# Patient Record
Sex: Female | Born: 1955
Health system: Southern US, Community
[De-identification: ages and names within clinical notes are randomized; demographics above are authoritative.]

## PROBLEM LIST (undated history)

## (undated) DIAGNOSIS — G43909 Migraine, unspecified, not intractable, without status migrainosus: Secondary | ICD-10-CM

## (undated) DIAGNOSIS — J45909 Unspecified asthma, uncomplicated: Secondary | ICD-10-CM

---

## 2018-05-16 MED FILL — HYDROCHLOROTHIAZIDE 25 MG T: 25 | 90 days supply | Qty: 180 | Fill #0

## 2018-05-19 DIAGNOSIS — N951 Menopausal and female climacteric states: Secondary | ICD-10-CM | POA: Diagnosis not present

## 2018-05-19 DIAGNOSIS — R635 Abnormal weight gain: Secondary | ICD-10-CM | POA: Diagnosis not present

## 2018-05-22 DIAGNOSIS — G43009 Migraine without aura, not intractable, without status migrainosus: Secondary | ICD-10-CM | POA: Diagnosis not present

## 2018-05-22 DIAGNOSIS — Z6827 Body mass index (BMI) 27.0-27.9, adult: Secondary | ICD-10-CM | POA: Diagnosis not present

## 2018-05-22 DIAGNOSIS — J4520 Mild intermittent asthma, uncomplicated: Secondary | ICD-10-CM | POA: Diagnosis not present

## 2018-05-22 DIAGNOSIS — E782 Mixed hyperlipidemia: Secondary | ICD-10-CM | POA: Diagnosis not present

## 2018-09-08 DIAGNOSIS — Z299 Encounter for prophylactic measures, unspecified: Secondary | ICD-10-CM | POA: Diagnosis not present

## 2018-09-08 DIAGNOSIS — T7840XA Allergy, unspecified, initial encounter: Secondary | ICD-10-CM | POA: Diagnosis not present

## 2018-09-08 DIAGNOSIS — Z789 Other specified health status: Secondary | ICD-10-CM | POA: Diagnosis not present

## 2018-09-08 DIAGNOSIS — E7800 Pure hypercholesterolemia, unspecified: Secondary | ICD-10-CM | POA: Diagnosis not present

## 2018-09-08 DIAGNOSIS — J45909 Unspecified asthma, uncomplicated: Secondary | ICD-10-CM | POA: Diagnosis not present

## 2018-09-08 DIAGNOSIS — J309 Allergic rhinitis, unspecified: Secondary | ICD-10-CM | POA: Diagnosis not present

## 2018-10-13 MED FILL — HYDROCHLOROTHIAZIDE 25 MG T: 25 | 30 days supply | Qty: 60 | Fill #0

## 2018-10-20 ENCOUNTER — Other Ambulatory Visit (HOSPITAL_COMMUNITY): Payer: Self-pay | Admitting: Internal Medicine

## 2018-10-20 DIAGNOSIS — E7800 Pure hypercholesterolemia, unspecified: Secondary | ICD-10-CM | POA: Diagnosis not present

## 2018-10-20 DIAGNOSIS — Z6828 Body mass index (BMI) 28.0-28.9, adult: Secondary | ICD-10-CM | POA: Diagnosis not present

## 2018-10-20 DIAGNOSIS — Z1231 Encounter for screening mammogram for malignant neoplasm of breast: Secondary | ICD-10-CM

## 2018-10-20 DIAGNOSIS — Z1331 Encounter for screening for depression: Secondary | ICD-10-CM | POA: Diagnosis not present

## 2018-10-20 DIAGNOSIS — Z79899 Other long term (current) drug therapy: Secondary | ICD-10-CM | POA: Diagnosis not present

## 2018-10-20 DIAGNOSIS — R5383 Other fatigue: Secondary | ICD-10-CM | POA: Diagnosis not present

## 2018-10-20 DIAGNOSIS — Z299 Encounter for prophylactic measures, unspecified: Secondary | ICD-10-CM | POA: Diagnosis not present

## 2018-10-20 DIAGNOSIS — Z0000 Encounter for general adult medical examination without abnormal findings: Secondary | ICD-10-CM | POA: Diagnosis not present

## 2018-10-21 ENCOUNTER — Other Ambulatory Visit: Payer: Self-pay

## 2018-10-21 ENCOUNTER — Encounter (HOSPITAL_COMMUNITY): Payer: Self-pay

## 2018-10-21 ENCOUNTER — Ambulatory Visit (HOSPITAL_COMMUNITY)
Admission: RE | Admit: 2018-10-21 | Discharge: 2018-10-21 | Disposition: A | Discharge status: Home / Self Care | Payer: 59 | Source: Ambulatory Visit | Attending: Internal Medicine | Admitting: Internal Medicine

## 2018-10-21 DIAGNOSIS — Z1231 Encounter for screening mammogram for malignant neoplasm of breast: Secondary | ICD-10-CM | POA: Diagnosis not present

## 2020-05-12 ENCOUNTER — Other Ambulatory Visit: Payer: Self-pay

## 2020-05-12 DIAGNOSIS — R202 Paresthesia of skin: Secondary | ICD-10-CM

## 2020-07-03 ENCOUNTER — Other Ambulatory Visit: Payer: Self-pay

## 2020-07-03 DIAGNOSIS — R202 Paresthesia of skin: Secondary | ICD-10-CM

## 2020-07-04 ENCOUNTER — Other Ambulatory Visit: Payer: Self-pay

## 2020-07-04 ENCOUNTER — Other Ambulatory Visit: Payer: Self-pay | Admitting: *Deleted

## 2020-07-04 ENCOUNTER — Ambulatory Visit (INDEPENDENT_AMBULATORY_CARE_PROVIDER_SITE_OTHER): Payer: No Typology Code available for payment source | Admitting: Neurology

## 2020-07-04 DIAGNOSIS — R202 Paresthesia of skin: Secondary | ICD-10-CM

## 2020-07-04 DIAGNOSIS — M5417 Radiculopathy, lumbosacral region: Secondary | ICD-10-CM

## 2020-07-04 NOTE — Procedures (Signed)
[facility]  [address] [place], [address]  [place], [place] [address] Tel: [phone] Fax:  [phone] Test Date:  07/04/2020  Patient: Cindy Diaz DOB: December 14, 1955 Physician: Nita Sickle, DO  Sex: Female Height: 5\' 2"  Ref Phys: , MD  ID#: [id number]   Technician:    Patient Complaints: This is a 65 year old female referred for evaluation of right foot drop.  NCV & EMG Findings: Extensive electrodiagnostic testing of the right lower extremity and additional studies of the left shows:  1. Bilateral superficial peroneal and right sural sensory responses are within normal limits.   2. Peroneal motor response at the extensor digitorum brevis is absent on the right, and normal on the left. Right peroneal motor response at the tibialis anterior is asymmetrically reduced  (3.4 mV), as compared to the left (5.7 mV).  Right tibial motor responses within normal limits. 3. Right tibial H reflex study is within normal limits. 4. Chronic motor axonal loss changes are seen affecting the L5 myotome, without accompanied active denervation.  Impression: 1. Chronic L5 radiculopathy affecting the right lower extremity, moderate-to-severe. 2. There is no evidence of a peroneal mononeuropathy or sensorimotor polyneuropathy affecting the right lower extremity.   ___________________________ 70, DO    Nerve Conduction Studies Anti Sensory Summary Table   Stim Site NR Peak (ms) Norm Peak (ms) P-T Amp (V) Norm P-T Amp  Left Sup Peroneal Anti Sensory (Ant Lat Mall)  33C  12 cm    2.2 <4.6 12.6 >3  Right Sup Peroneal Anti Sensory (Ant Lat Mall)  12 cm    3.2 <4.6 10.4 >3  Right Sural Anti Sensory (Lat Mall)  Calf    2.6 <4.6 18.1 >3   Motor Summary Table   Stim Site NR Onset (ms) Norm Onset (ms) O-P Amp (mV) Norm O-P Amp Site1 Site2 Delta-0 (ms) Dist (cm) Vel (m/s) Norm Vel (m/s)  Left Peroneal Motor (Ext Dig Brev)  33C  Ankle    3.2 <6.0 5.3 >2.5  B Fib Ankle 5.9 33.0 56 >40  B Fib    9.1  5.1  Poplt B Fib 1.4 8.0 57 >40  Poplt    10.5  5.0         Right Peroneal Motor (Ext Dig Brev)  Ankle NR  <6.0  >2.5 B Fib Ankle  0.0  >40  B Fib NR     Poplt B Fib  0.0  >40  Poplt NR            Left Peroneal TA Motor (Tib Ant)  33C  Fib Head    2.0 <4.5 5.7 >3 Poplit Fib Head 1.0 8.0 80 >40  Poplit    3.0  5.7         Right Peroneal TA Motor (Tib Ant)  Fib Head    2.9 <4.5 3.4 >3 Poplit Fib Head 1.0 8.0 80 >40  Poplit    3.9  3.0         Right Tibial Motor (Abd Hall Brev)  Ankle    3.4 <6.0 10.3 >4 Knee Ankle 6.9 39.0 57 >40  Knee    10.3  7.6          H Reflex Studies   NR H-Lat (ms) Lat Norm (ms) L-R H-Lat (ms)  Right Tibial (Gastroc)     25.71 <35    EMG   Side Muscle Ins Act Fibs Psw Fasc Number Recrt Dur Dur. Amp Amp. Poly Poly. Comment  Right AntTibialis Nml Nml Nml Nml [facility] Rapid All 1+ All 1+ All 1+ N/A  Right Gastroc Nml Nml Nml Nml Nml Nml Nml Nml Nml Nml Nml Nml N/A  Right Flex Dig Long Nml Nml Nml Nml 3- Rapid Most 1+ Many 1+ Most 1+ N/A  Right RectFemoris Nml Nml Nml Nml Nml Nml Nml Nml Nml Nml Nml Nml N/A  Right GluteusMed Nml Nml Nml Nml 2- Rapid Most 1+ Many 1+ Most 1+ N/A  Right Lumbo Parasp Low Nml Nml Nml Nml Nml Nml Nml Nml Nml Nml Nml Nml N/A  Right Fibularis Long Nml Nml Nml Nml [facility] Rapid All 1+ All 1+ All 1+ N/A      Waveforms:

## 2020-08-15 IMAGING — MG MM DIGITAL SCREENING BILAT W/ TOMO W/ CAD
8 series · 9 of 24 positions shown · non-contrast
Comparison: Previous exam(s).

CLINICAL DATA: Screening.

EXAM:
DIGITAL SCREENING BILATERAL MAMMOGRAM WITH TOMO AND CAD

[R CC synth-2D]
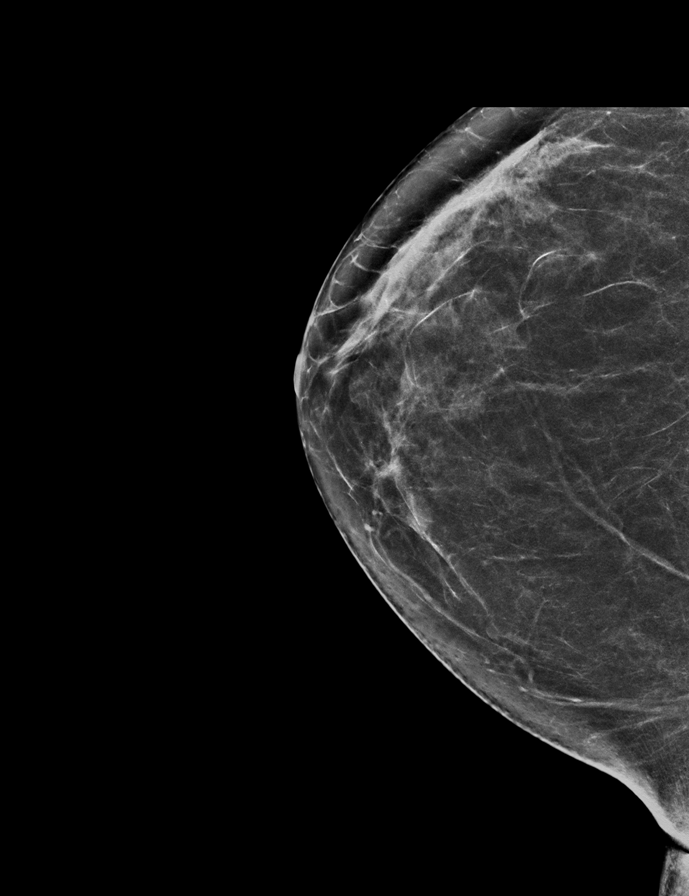

[R MLO synth-2D]
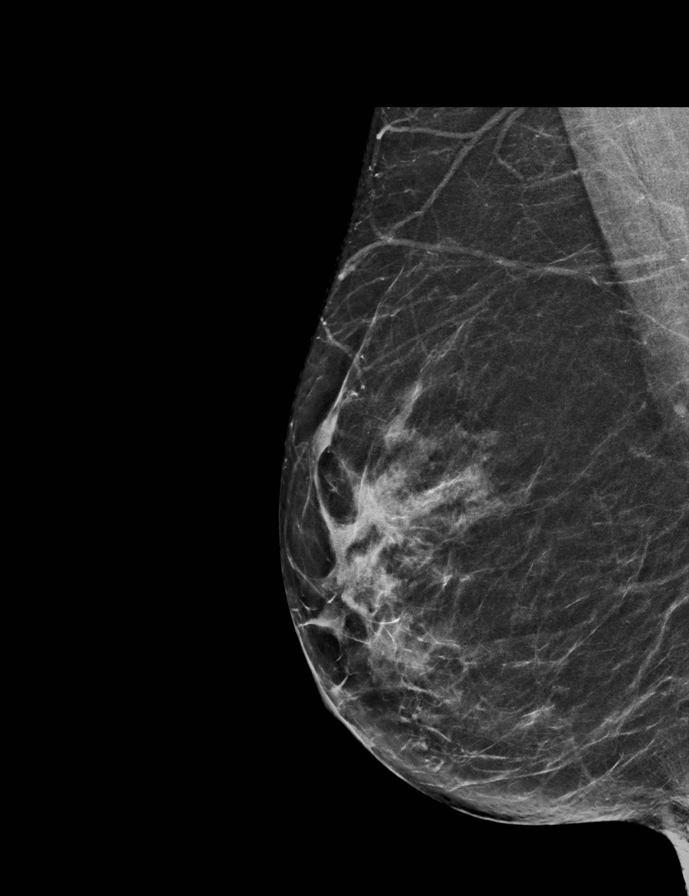

[L CC synth-2D]
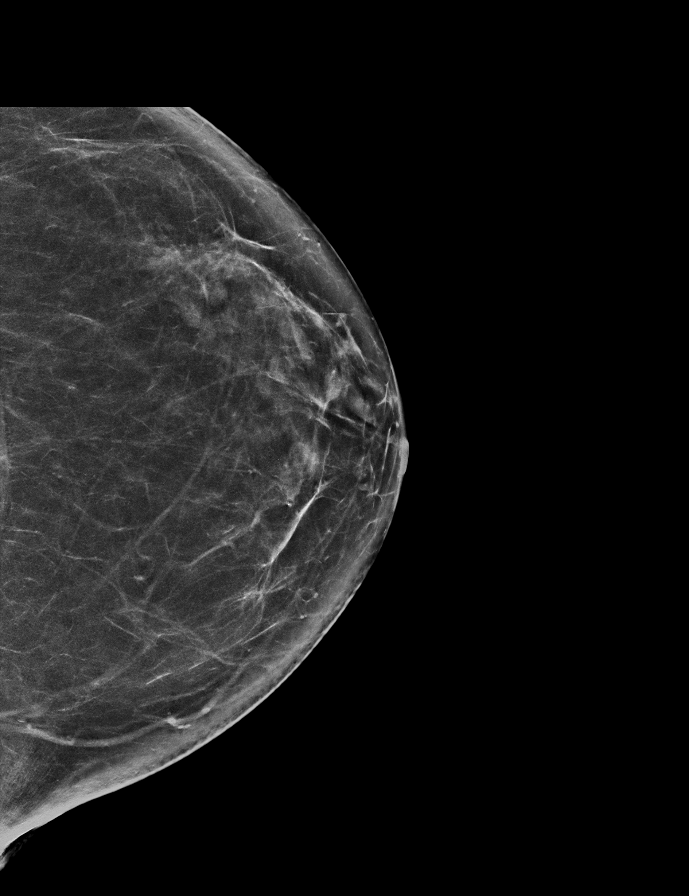

[L MLO synth-2D]
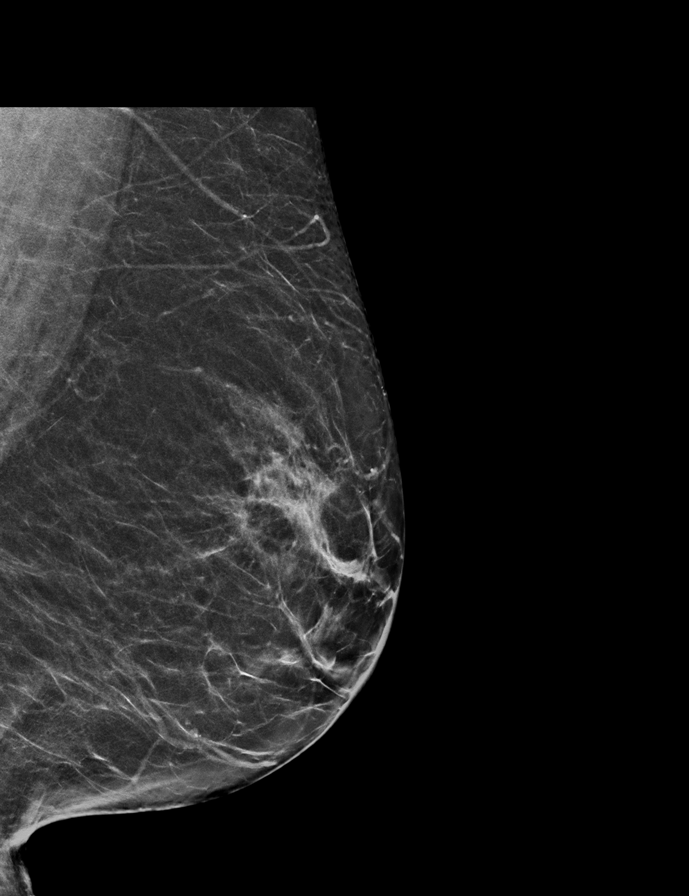

[L MLO tomo · 2 of 62 frames shown]
[frame 21/62]
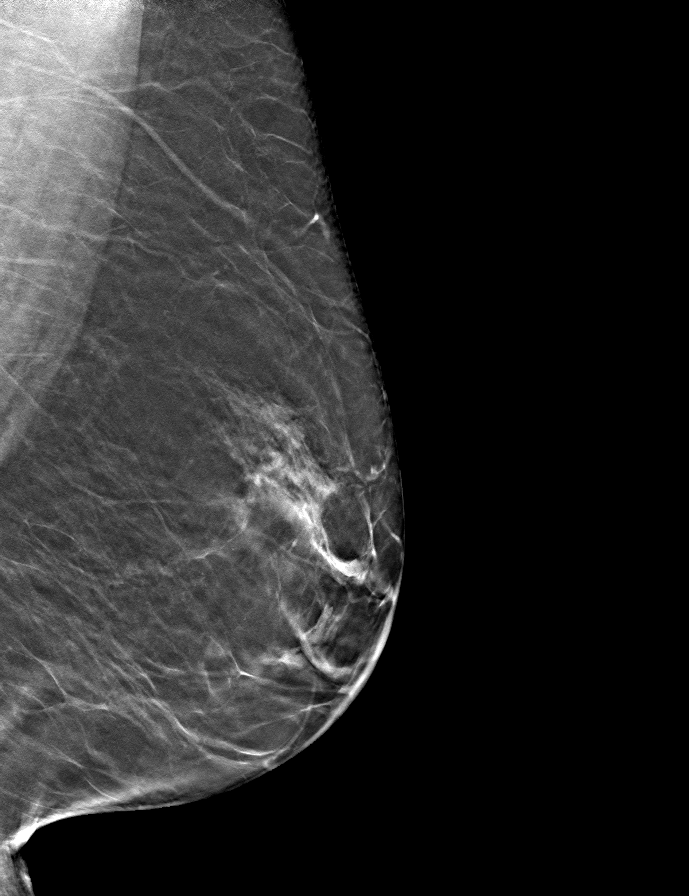
[frame 31/62]
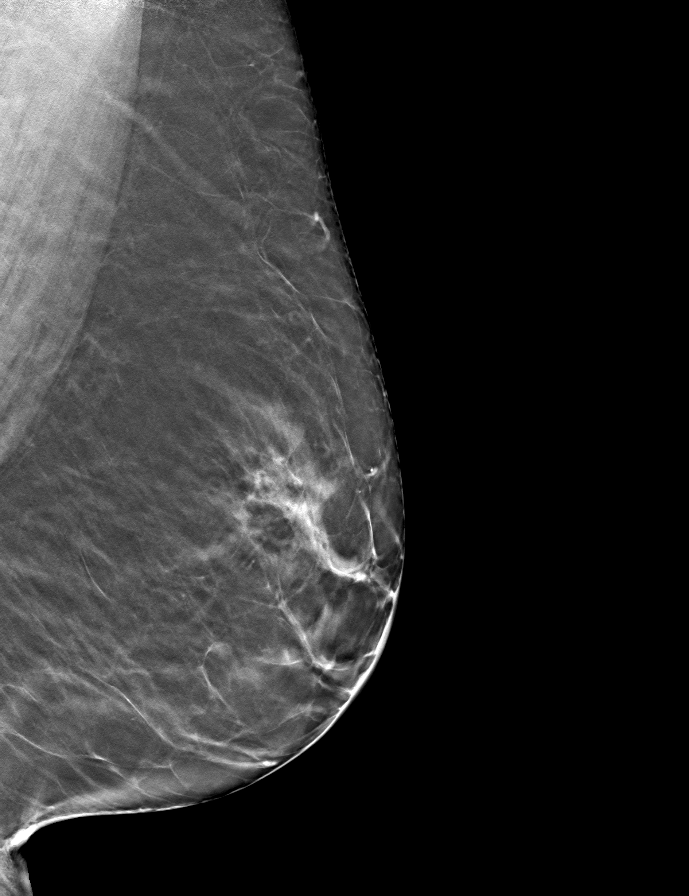

[R MLO tomo · tomo slice 33/66.0]
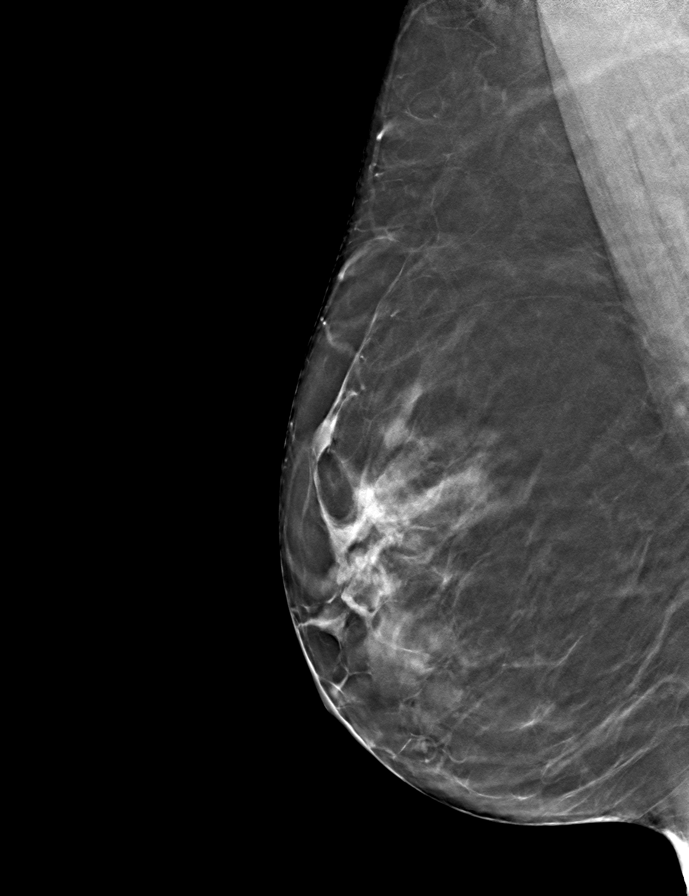

[R CC tomo · tomo slice 34/67.0]
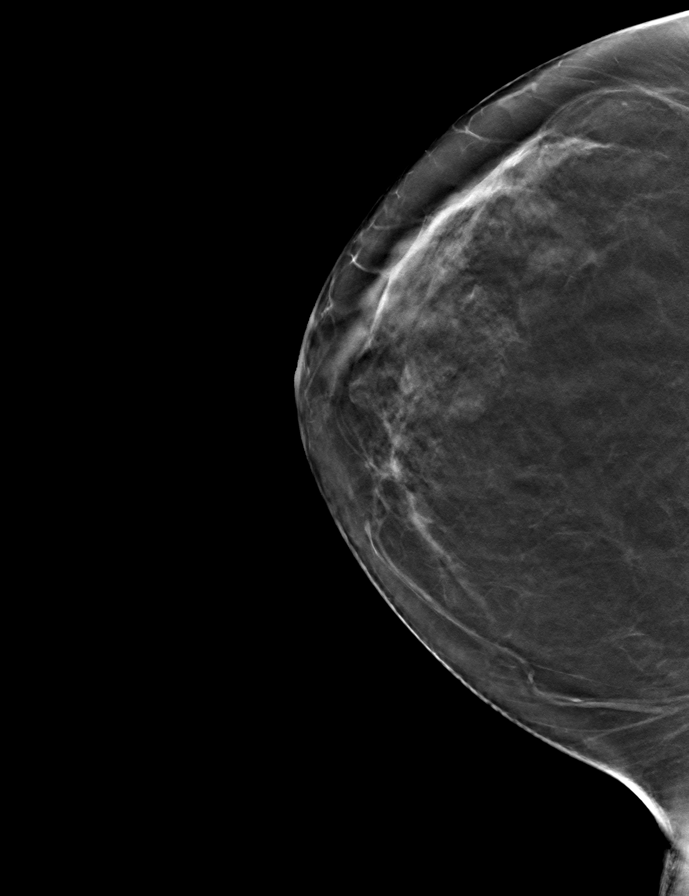

[L CC tomo · tomo slice 35/69.0]
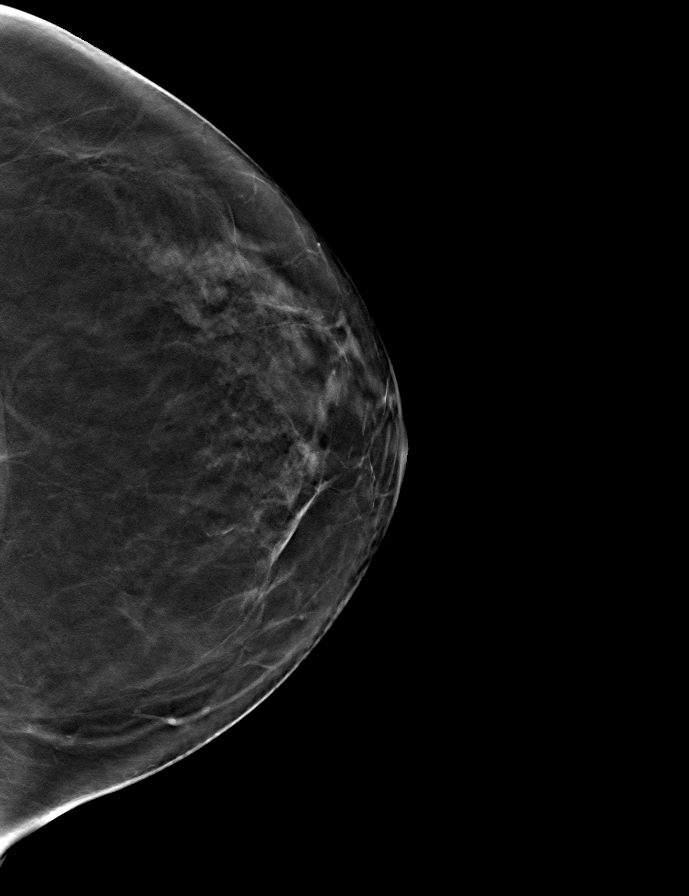

[9 of 24 positions shown; findings below may reference images not displayed]

ACR Breast Density Category b: There are scattered areas of
fibroglandular density.
FINDINGS: There are no findings suspicious for malignancy. Images were
processed with CAD.
IMPRESSION: No mammographic evidence of malignancy. A result letter of this
screening mammogram will be mailed directly to the patient.

RECOMMENDATION:
Screening mammogram in one year. (Code:[id number])

BI-RADS CATEGORY  1: Negative.

## 2021-07-04 ENCOUNTER — Ambulatory Visit: Payer: Non-veteran care | Admitting: Physical Therapy

## 2021-07-17 ENCOUNTER — Telehealth: Payer: Self-pay | Admitting: Physical Therapy

## 2021-07-17 NOTE — Telephone Encounter (Signed)
LVM that we were returning her call and to call back and provided the clinic number.

## 2021-07-18 ENCOUNTER — Ambulatory Visit: Payer: Non-veteran care | Admitting: Physical Therapy

## 2021-08-01 ENCOUNTER — Ambulatory Visit: Payer: Non-veteran care | Admitting: Physical Therapy

## 2023-06-04 ENCOUNTER — Other Ambulatory Visit: Payer: Self-pay

## 2023-06-04 ENCOUNTER — Emergency Department (HOSPITAL_COMMUNITY)

## 2023-06-04 ENCOUNTER — Encounter (HOSPITAL_COMMUNITY): Payer: Self-pay

## 2023-06-04 ENCOUNTER — Emergency Department (HOSPITAL_COMMUNITY)
Admission: EM | Admit: 2023-06-04 | Discharge: 2023-06-04 | Disposition: A | Discharge status: Home / Self Care | Attending: Emergency Medicine | Admitting: Emergency Medicine

## 2023-06-04 DIAGNOSIS — S92155A Nondisplaced avulsion fracture (chip fracture) of left talus, initial encounter for closed fracture: Secondary | ICD-10-CM | POA: Diagnosis not present

## 2023-06-04 DIAGNOSIS — J45909 Unspecified asthma, uncomplicated: Secondary | ICD-10-CM | POA: Insufficient documentation

## 2023-06-04 DIAGNOSIS — W108XXA Fall (on) (from) other stairs and steps, initial encounter: Secondary | ICD-10-CM | POA: Diagnosis not present

## 2023-06-04 DIAGNOSIS — S99912A Unspecified injury of left ankle, initial encounter: Secondary | ICD-10-CM | POA: Diagnosis present

## 2023-06-04 DIAGNOSIS — S93602A Unspecified sprain of left foot, initial encounter: Secondary | ICD-10-CM

## 2023-06-04 HISTORY — DX: Migraine, unspecified, not intractable, without status migrainosus: G43.909

## 2023-06-04 HISTORY — DX: Unspecified asthma, uncomplicated: J45.909

## 2023-06-04 NOTE — ED Triage Notes (Signed)
 Pt was going down the 3 steps and missed one. Pt left ankle is swollen and purple.

## 2023-06-05 NOTE — ED Provider Notes (Signed)
 [place] [facility] AT [facility] Provider Note   CSN: [id number] Arrival date & time: 06/04/23  1756     History  Chief Complaint  Patient presents with   Ankle Pain    Cindy Diaz is a 68 y.o. female.  Pt is a 68 yo female with pmhx significant for migraines and asthma.  She was going down some steps and missed one and fell.  She hurt her left ankle and has been unable to walk on it.  Pt actually has pain more on the top and bottom of her foot.  She denies any other trauma.         Home Medications Prior to Admission medications   Not on File      Allergies    Morphine and codeine, Asa [aspirin], Bee venom, Ciprofloxacin, Demerol [meperidine hcl], Ivp dye [iodinated contrast media], Keflex [cephalexin], and Penicillins    Review of Systems   Review of Systems  Musculoskeletal:        Left foot/ankle pain  All other systems reviewed and are negative.   Physical Exam Updated Vital Signs BP (!) 165/85   Pulse 65   Temp 98.9 F (37.2 C)   Resp 17   Ht 5\' 2"  (1.575 m)   Wt 70.3 kg   SpO2 98%   BMI 28.35 kg/m  Physical Exam Vitals and nursing note reviewed.  Constitutional:      Appearance: Normal appearance.  HENT:     Head: Normocephalic and atraumatic.     Right Ear: External ear normal.     Left Ear: External ear normal.     Nose: Nose normal.     Mouth/Throat:     Mouth: Mucous membranes are moist.     Pharynx: Oropharynx is clear.  Eyes:     Extraocular Movements: Extraocular movements intact.     Conjunctiva/sclera: Conjunctivae normal.     Pupils: Pupils are equal, round, and reactive to light.  Cardiovascular:     Rate and Rhythm: Normal rate and regular rhythm.     Pulses: Normal pulses.     Heart sounds: Normal heart sounds.  Pulmonary:     Effort: Pulmonary effort is normal.     Breath sounds: Normal breath sounds.  Abdominal:     General: Abdomen is flat. Bowel sounds are normal.     Palpations: Abdomen  is soft.  Musculoskeletal:     Cervical back: Normal range of motion and neck supple.       Feet:  Skin:    General: Skin is warm.     Capillary Refill: Capillary refill takes less than 2 seconds.  Neurological:     General: No focal deficit present.     Mental Status: She is alert.  Psychiatric:        Mood and Affect: Mood normal.        Behavior: Behavior normal.     ED Results / Procedures / Treatments   Labs (all labs ordered are listed, but only abnormal results are displayed) Labs Reviewed - No data to display  EKG None  Radiology DG Ankle Left Port Result Date: 06/04/2023 CLINICAL DATA:  [id number] Injury [id number] EXAM: PORTABLE LEFT ANKLE - 2 VIEW COMPARISON:  None Available. FINDINGS: Acute avulsion fracture of the talar neck. Associated ankle effusion subcutaneus soft tissue edema of the dorsum of the foot. There is no evidence of arthropathy or other focal bone abnormality. IMPRESSION: Acute avulsion fracture of the talar neck.  Electronically Signed   By: Tish Frederickson M.D.   On: 06/04/2023 21:26    Procedures Procedures    Medications Ordered in ED Medications - No data to display  ED Course/ Medical Decision Making/ A&P                                 Medical Decision Making Amount and/or Complexity of Data Reviewed Radiology: ordered.   This patient presents to the ED for concern of ankle pain, this involves an extensive number of treatment options, and is a complaint that carries with it a high risk of complications and morbidity.  The differential diagnosis includes sprain, fx   Co morbidities that complicate the patient evaluation  migraines and asthma   Additional history obtained:  Additional history obtained from epic chart review External records from outside source obtained and reviewed including family   Imaging Studies ordered:  I ordered imaging studies including l ankle  I independently visualized and interpreted imaging which  showed Acute avulsion fracture of the talar neck.  I agree with the radiologist interpretation  Medicines ordered and prescription drug management:   I have reviewed the patients home medicines and have made adjustments as needed   Problem List / ED Course:  Talus fx + likely foot sprain:  Pt placed in a cam walker and is discharged with instructions to return if worse.  F/u with ortho.  She did not want any pain meds here or for home.    Reevaluation:  After the interventions noted above, I reevaluated the patient and found that they have :improved   Social Determinants of Health:  Lives at home   Dispostion:  After consideration of the diagnostic results and the patients response to treatment, I feel that the patent would benefit from discharge with outpatient f/u.          Final Clinical Impression(s) / ED Diagnoses Final diagnoses:  Closed nondisplaced avulsion fracture of left talus, initial encounter  Sprain of left foot, initial encounter    Rx / DC Orders ED Discharge Orders     None         Jacalyn Lefevre, MD 06/05/23 1529
